# Patient Record
Sex: Female | Born: 1990 | Race: White | Hispanic: No | Marital: Single | Smoking: Current every day smoker
Health system: Southern US, Community
[De-identification: ages and names within clinical notes are randomized; demographics above are authoritative.]

## PROBLEM LIST (undated history)

## (undated) DIAGNOSIS — F419 Anxiety disorder, unspecified: Secondary | ICD-10-CM

## (undated) DIAGNOSIS — N926 Irregular menstruation, unspecified: Secondary | ICD-10-CM

## (undated) DIAGNOSIS — O1490 Unspecified pre-eclampsia, unspecified trimester: Secondary | ICD-10-CM

## (undated) DIAGNOSIS — Q270 Congenital absence and hypoplasia of umbilical artery: Secondary | ICD-10-CM

## (undated) DIAGNOSIS — F1910 Other psychoactive substance abuse, uncomplicated: Secondary | ICD-10-CM

## (undated) HISTORY — DX: Congenital absence and hypoplasia of umbilical artery: Q27.0

## (undated) HISTORY — DX: Irregular menstruation, unspecified: N92.6

## (undated) HISTORY — DX: Other psychoactive substance abuse, uncomplicated: F19.10

## (undated) HISTORY — DX: Anxiety disorder, unspecified: F41.9

## (undated) HISTORY — DX: Unspecified pre-eclampsia, unspecified trimester: O14.90

---
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
## 2010-02-28 ENCOUNTER — Emergency Department: Payer: Self-pay | Admitting: Emergency Medicine

## 2010-06-12 ENCOUNTER — Emergency Department: Payer: Self-pay | Admitting: Emergency Medicine

## 2010-07-03 ENCOUNTER — Emergency Department: Payer: Self-pay | Admitting: Unknown Physician Specialty

## 2013-02-21 LAB — HM PAP SMEAR: HM Pap smear: NEGATIVE

## 2013-06-26 DIAGNOSIS — O1490 Unspecified pre-eclampsia, unspecified trimester: Secondary | ICD-10-CM

## 2013-06-26 HISTORY — DX: Unspecified pre-eclampsia, unspecified trimester: O14.90

## 2013-09-08 ENCOUNTER — Inpatient Hospital Stay: Payer: Self-pay

## 2013-09-08 LAB — PIH PROFILE
AST: 24 U/L (ref 15–37)
Anion Gap: 6 — ABNORMAL LOW (ref 7–16)
Anion Gap: 7 (ref 7–16)
BUN: 6 mg/dL — ABNORMAL LOW (ref 7–18)
BUN: 6 mg/dL — AB (ref 7–18)
CALCIUM: 9.2 mg/dL (ref 8.5–10.1)
CO2: 22 mmol/L (ref 21–32)
CREATININE: 0.78 mg/dL (ref 0.60–1.30)
Calcium, Total: 9.1 mg/dL (ref 8.5–10.1)
Chloride: 109 mmol/L — ABNORMAL HIGH (ref 98–107)
Chloride: 110 mmol/L — ABNORMAL HIGH (ref 98–107)
Co2: 23 mmol/L (ref 21–32)
Creatinine: 0.86 mg/dL (ref 0.60–1.30)
EGFR (Non-African Amer.): >60
EGFR (Non-African Amer.): >60
Glucose: 83 mg/dL (ref 65–99)
Glucose: 98 mg/dL (ref 65–99)
HCT: 36.7 % (ref 35.0–47.0)
HCT: 36.8 % (ref 35.0–47.0)
HGB: 12.7 g/dL (ref 12.0–16.0)
HGB: 12.7 g/dL (ref 12.0–16.0)
MCH: 34.6 pg — AB (ref 26.0–34.0)
MCH: 34.5 pg — AB (ref 26.0–34.0)
MCHC: 34.5 g/dL (ref 32.0–36.0)
MCHC: 34.5 g/dL (ref 32.0–36.0)
MCV: 100 fL (ref 80–100)
MCV: 100 fL (ref 80–100)
OSMOLALITY: 272 (ref 275–301)
OSMOLALITY: 275 (ref 275–301)
POTASSIUM: 3.6 mmol/L (ref 3.5–5.1)
POTASSIUM: 3.6 mmol/L (ref 3.5–5.1)
Platelet: 297 10*3/uL (ref 150–440)
Platelet: 288 10*3/uL (ref 150–440)
RBC: 3.66 10*6/uL — ABNORMAL LOW (ref 3.80–5.20)
RBC: 3.68 10*6/uL — AB (ref 3.80–5.20)
RDW: 12.2 % (ref 11.5–14.5)
RDW: 12.3 % (ref 11.5–14.5)
SGOT(AST): 16 U/L (ref 15–37)
SODIUM: 139 mmol/L (ref 136–145)
Sodium: 138 mmol/L (ref 136–145)
URIC ACID: 4.3 mg/dL (ref 2.6–6.0)
Uric Acid: 4.2 mg/dL (ref 2.6–6.0)
WBC: 13.2 10*3/uL — AB (ref 3.6–11.0)
WBC: 12.9 10*3/uL — ABNORMAL HIGH (ref 3.6–11.0)

## 2013-09-08 LAB — DRUG SCREEN, URINE
Amphetamines, Ur Screen: NEGATIVE (ref ?–1000)
Barbiturates, Ur Screen: NEGATIVE (ref ?–200)
Benzodiazepine, Ur Scrn: NEGATIVE (ref ?–200)
Cannabinoid 50 Ng, Ur ~~LOC~~: NEGATIVE (ref ?–50)
Cocaine Metabolite,Ur ~~LOC~~: POSITIVE (ref ?–300)
MDMA (Ecstasy)Ur Screen: NEGATIVE (ref ?–500)
Methadone, Ur Screen: NEGATIVE (ref ?–300)
Opiate, Ur Screen: NEGATIVE (ref ?–300)
Phencyclidine (PCP) Ur S: NEGATIVE (ref ?–25)
TRICYCLIC, UR SCREEN: NEGATIVE (ref ?–1000)

## 2013-09-08 LAB — PROTEIN / CREATININE RATIO, URINE
CREATININE, URINE: 40.4 mg/dL (ref 30.0–125.0)
Protein, Random Urine: <5 mg/dL — ABNORMAL LOW (ref 0–12)

## 2013-09-09 LAB — PROTEIN, URINE, 24 HOUR
Collection Hours: 24 hours
Protein, 24 Hour Urine: 450 mg/24HR — ABNORMAL HIGH (ref 30–149)
Protein, Urine: 75 mg/dL (ref 0–12)
TOTAL VOLUME C4TV(ML): 600 mL

## 2013-09-10 DIAGNOSIS — O1490 Unspecified pre-eclampsia, unspecified trimester: Secondary | ICD-10-CM

## 2013-09-10 LAB — PIH PROFILE
ANION GAP: 6 — AB (ref 7–16)
AST: 23 U/L (ref 15–37)
BUN: 9 mg/dL (ref 7–18)
CALCIUM: 8.4 mg/dL — AB (ref 8.5–10.1)
CO2: 22 mmol/L (ref 21–32)
CREATININE: 0.79 mg/dL (ref 0.60–1.30)
Chloride: 109 mmol/L — ABNORMAL HIGH (ref 98–107)
EGFR (African American): >60
GLUCOSE: 94 mg/dL (ref 65–99)
HCT: 36.3 % (ref 35.0–47.0)
HGB: 12.3 g/dL (ref 12.0–16.0)
MCH: 34.3 pg — ABNORMAL HIGH (ref 26.0–34.0)
MCHC: 33.9 g/dL (ref 32.0–36.0)
MCV: 101 fL — ABNORMAL HIGH (ref 80–100)
OSMOLALITY: 272 (ref 275–301)
Platelet: 285 10*3/uL (ref 150–440)
Potassium: 3.7 mmol/L (ref 3.5–5.1)
RBC: 3.59 10*6/uL — AB (ref 3.80–5.20)
RDW: 12.7 % (ref 11.5–14.5)
SODIUM: 137 mmol/L (ref 136–145)
URIC ACID: 3.8 mg/dL (ref 2.6–6.0)
WBC: 17.9 10*3/uL — AB (ref 3.6–11.0)

## 2013-09-11 LAB — HEMATOCRIT: HCT: 30.3 % — AB (ref 35.0–47.0)

## 2013-09-12 LAB — PATHOLOGY REPORT

## 2013-09-12 LAB — BETA STREP CULTURE(ARMC)

## 2013-09-13 LAB — GC/CHLAMYDIA PROBE AMP

## 2014-10-17 NOTE — Consult Note (Signed)
   Maternal Age 24   Gravida 2   Para 0   Term Deliveries 0   Preterm Deliveries 0   Abortions 1   Living Children 0   Final EDD (dd-mmm-yy) 23-Oct-2013   Blood Type (Maternal) A positive   Antibody Screen Results (Maternal) negative   HIV Results (Maternal) negative   Gonorrhea Results (Maternal) negative   Chlamydia Results (Maternal) negative   Hepatitis C Culture (Maternal) unknown   Herpes Results (Maternal) n/a   VDRL/RPR/Syphilis Results (Maternal) negative   Rubella Results (Maternal) immune   Hepatitis B Surface Antigen Results (Maternal) negative   Group B Strep Results Maternal (Result >5wks must be treated as unknown) unknown/result > 5 weeks ago   Prenatal Care Adequate    Additional Comments Prenatal consult requested by Dr. Janene HarveyKlett, M.D. The patient is 24 yo G2 P 0010, EDD of 10/23/13 per 6 wk US. She is single and the dad is involved. [facility] at 4554w4d for routine visit and BP noted to be 130/80 & 150/100 with 1+proteinuria - sent for further evaluation. [facility], tobacco use (now approx 1 cig/day), SUA, EFW at 11% at 31 wks (f/u scheduled this week). Female fetus with 2 vessel cord. H/o migraines and anxiety previously treated with Gabapentin and Celexa in the past.    Patient was found to be positive for cocaine by UDS on admission. She has increased proteinuria and the fetus is in breech position. She has received 2 doses of betamethasone (3/16 and 3/17). She is on magnesium sulfate and also on IV labetalol for worsening hypertension; her proteinuria was 450 mg% on 3/17. The OB has decided to proceed with C/S today, 3/18 in the afternoon/evening. Indication for C/S: severe preeclampsia with worsening hypertension and breech.   Parental Contact Parents informed at length regarding prenatal care and plan, Discussed about the outcome of babies born at 33-34 wks, including survival, morbidity, and length of stay.   Recommended to pump and provide breast milk after delivery and she plans to do.  Answered all of her questions. Total time spent reviewing the note, counseling the parents and consult t note was 35 minutes.   Electronic Signatures: Lior Hoen, Margaretann LovelessGanapathy (MD)  (Signed 18-Mar-15 13:59)  Authored: PREGNANCY and LABOR, ADDITIONAL COMMENTS   Last Updated: 18-Mar-15 13:59 by Herminio Commonsama, Dshaun Reppucci (MD)

## 2014-10-17 NOTE — Op Note (Signed)
PATIENT NAME:  Carly SkeneCARTER, Elmyra S MR#:  [id number] DATE OF BIRTH:  Apr 25, 1991  DATE OF PROCEDURE:  09/15/2013  PREOPERATIVE DIAGNOSES: Breech with severe preeclampsia and superimposed cocaine abuse. Positive urine drug screen at time of delivery.  POSTOPERATIVE DIAGNOSES: Breech with severe preeclampsia and superimposed cocaine abuse. Positive urine drug screen at time of delivery. Preterm.   PROCEDURE: Primary LUT C-section.   SPINAL: Spinal.   SURGEON: Elliot Gurneyarrie C Nikelle Malatesta, M.D.   ASSISTANT: Farrel Connersolleen Gutierrez.   ESTIMATED BLOOD LOSS: 1000 mL.   FINDINGS: Preterm liveborn female infant by C-section, breech, Apgars 8 and 9, weighing 3 pounds, 8 ounces. Normal uterus, tubes and ovaries. No uterine septum; however, prominent tubal ostia fossa, normal placenta sent for pathology.   ESTIMATED BLOOD LOSS: (Dictation Anomaly)1000 cc .  DESCRIPTION OF PROCEDURE: The patient was taken to the Operating Room and placed in supine position. After adequate spinal anesthesia was instilled, the patient was prepped and draped in the usual sterile fashion. Pfannenstiel skin incision was drawn on the patient's belly. Incision was made with the knife and carried sharply down to the fascia. The fascia was nicked in the midline. The incision was extended in a superolateral manner with curved Mayo scissors. The fascia was grasped with Coker clamps and the fascia and muscles were sharply and bluntly dissected off each other both inferiorly and superiorly. The midline of the muscle bellies was identified, grasped and entered. This was opened with the surgeon's fingers and Metzenbaum. The peritoneum was grasped, cut and entered. Bladder blade was placed. A bladder flap was created. Uterine incision was made. The infant's head was delivered. The infant was bulb suctioned. The remainder of the infant was delivered. Cord was clamped and cut. Infant was handed to the awaiting pediatricians. Cord blood was obtained. Pitocin was  started. Placenta delivered on its own and sent to pathology. The uterus was delivered and wrapped in a moist laparotomy sponge. The interior of the uterus was curetted with a moist lap sponge. Uterine incision was closed with a running locked chromic suture and then again with a secondary imbricating suture. Bladder flap was tacked back up to the uterine incision with a 3-0 Monocryl. The belly was cleared of clots. The gutters were cleared of clot. The muscle belly was closed in approximated with 0 Vicryl suture in a running fashion. The On-Q trocars were placed. The catheters were threaded. Dermabond was placed. Catheters were placed beneath the fascia and above the muscle fascia was closed with a running Vicryl suture. EXLH was then used to approximate the subcutaneous fat across Camper's and Scarpa's fascia. 4-0 Monocryl was used to approximate the skin edges. The On-Q was wrapped around a 4 x 4 and tacked down with Steri-Strips and Tegaderm. Clear urine was noted in the Foley bag. The patient was taken to recovery after having tolerated the procedure well and after benzoin and Steri-Strips were placed on the uterine incision.   ____________________________ Elliot Gurneyarrie C. Eduarda Scrivens, MD cck:sg D: 09/15/2013 23:15:35 ET T: 09/16/2013 06:21:31 ET JOB#: [id number]  cc: Elliot Gurneyarrie C. Hikari Tripp, MD, <Dictator> Elliot GurneyARRIE C Tomica Arseneault MD ELECTRONICALLY SIGNED 09/30/2013 14:43

## 2014-11-03 NOTE — H&P (Signed)
L&D Evaluation:  History Expanded:  HPI 24 yo G2 P 0010, EDD of 10/23/13 per 6 wk US. Seen at office today at 6370w4d for routine visit and BP noted to be 130/80 & 150/100 with 1+proteinuria - sent for further evaluation. Pt denies recent headache or visual changes, RUQ, LOF, VB or ctx. +FM. [facility], tobacco use (now approx 1 cig/day), SUA, EFW at 11% at 31 wks (f/u scheduled this week). H/o migraines and anxiety previously treated with Gabapentin and Celexa. She has been off of these medications but has noted some anxiety sx recently.   Blood Type (Maternal) A positive   Group B Strep Results Maternal (Result >5wks must be treated as unknown) unknown/result > 5 weeks ago   Maternal HIV Negative   Maternal Syphilis Ab Nonreactive   Maternal Varicella Immune   Rubella Results (Maternal) immune   Maternal T-Dap Immune   Patient's Medical History Anxiety   Patient's Surgical History none   Medications Pre Natal Vitamins   Allergies Zyrtec   Social History tobacco   Exam:  Vital Signs BP >140/90  BP: 152-198/82-107, T: 99.1, P: 55-88,   Urine Protein trace, PC Ratio: protein too low to calculate   General no apparent distress   Mental Status clear   Chest expiratory wheezing - pt reports recent congestion and cough   Heart no murmur/gallop/rubs   Abdomen gravid, non-tender   Estimated Fetal Weight Small for gestational age   Edema no edema   Reflexes 2+   Mebranes Intact   FHT normal rate with no decels, category 1 tracing   Ucx absent   Skin dry   Impression:  Impression IUP at 7070w4d with gestational hypertension   Plan:  Comments POM reviewed with Dr Jean RosenthalJackson:  IV Labetalol for severe range BP's.  Betamethasone for fetal lung maturity Will obtain growth US today Begin 24 hour urine CXR as pt has had cough and wheezing Repeat labs this evening   Follow Up Appointment already scheduled   Electronic Signatures: Vella KohlerBrothers,  Argusta Mcgann K (CNM)  (Signed 16-Mar-15 15:22)  Authored: L&D Evaluation   Last Updated: 16-Mar-15 15:22 by Vella KohlerBrothers, Terryann Verbeek K (CNM)

## 2015-06-27 DIAGNOSIS — W01198A Fall on same level from slipping, tripping and stumbling with subsequent striking against other object, initial encounter: Secondary | ICD-10-CM | POA: Diagnosis not present

## 2015-06-27 DIAGNOSIS — Y998 Other external cause status: Secondary | ICD-10-CM | POA: Insufficient documentation

## 2015-06-27 DIAGNOSIS — S0993XA Unspecified injury of face, initial encounter: Secondary | ICD-10-CM | POA: Diagnosis present

## 2015-06-27 DIAGNOSIS — Y9301 Activity, walking, marching and hiking: Secondary | ICD-10-CM | POA: Diagnosis not present

## 2015-06-27 DIAGNOSIS — S01511A Laceration without foreign body of lip, initial encounter: Secondary | ICD-10-CM | POA: Insufficient documentation

## 2015-06-27 DIAGNOSIS — Y9289 Other specified places as the place of occurrence of the external cause: Secondary | ICD-10-CM | POA: Diagnosis not present
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
## 2015-06-28 ENCOUNTER — Emergency Department
Admission: EM | Admit: 2015-06-28 | Discharge: 2015-06-28 | Discharge status: Against Medical Advice | Payer: Medicaid Other | Attending: Emergency Medicine | Admitting: Emergency Medicine

## 2015-06-28 NOTE — ED Notes (Signed)
Called pt in lobby x3 without response.

## 2016-06-26 HISTORY — PX: MULTIPLE TOOTH EXTRACTIONS: SHX2053

## 2016-08-24 ENCOUNTER — Telehealth: Payer: Self-pay | Admitting: Obstetrics and Gynecology

## 2016-08-24 NOTE — Telephone Encounter (Signed)
Noted. Will order to arrive by apt date/time. 

## 2016-08-24 NOTE — Telephone Encounter (Signed)
Pt is schedule for annual with Jean RosenthalJackson on 09/19/16. Pt is Also schedule on 10/25/16 for removal and insertion for nexplanon.

## 2016-09-19 ENCOUNTER — Ambulatory Visit: Payer: Medicaid Other | Admitting: Obstetrics and Gynecology

## 2016-10-11 NOTE — Telephone Encounter (Signed)
Reschedule 10/25/16 from an nexplanon removal and insertion to Annual exam with [name]

## 2016-10-11 NOTE — Telephone Encounter (Signed)
Pt is schedule 11/07/16 for nexplanon removal and insertion

## 2016-10-11 NOTE — Telephone Encounter (Signed)
Pt was a No Show for AE 09/19/16. Please contact to R/S AE. Needs AE prior to Nexplanon Removal/Replacement which is scheduled for 10/25/16.

## 2016-10-12 NOTE — Telephone Encounter (Signed)
Noted. Will order to arrive by apt date/time. 

## 2016-10-25 ENCOUNTER — Telehealth: Payer: Self-pay

## 2016-10-25 ENCOUNTER — Encounter: Payer: Self-pay | Admitting: Obstetrics and Gynecology

## 2016-10-25 ENCOUNTER — Ambulatory Visit: Payer: Medicaid Other | Admitting: Obstetrics and Gynecology

## 2016-10-25 NOTE — Telephone Encounter (Signed)
Pt will need to be seen for Annual before anything is sent in since she cancelled todays appt. Pt needs to be aware that we will see pt for annual and will then schedule a separate appt for nexplanon removal and insertion for the same time.

## 2016-10-25 NOTE — Telephone Encounter (Signed)
Left detailed msg.

## 2016-10-25 NOTE — Telephone Encounter (Signed)
Pt calling - was supposed to have appt today but cancelled today.  Nexplanon is supposed to come out on the 6th.  Needs something called in to last her from the 6th until insertion of another nexplanon,.  [phone]

## 2016-11-07 ENCOUNTER — Ambulatory Visit: Payer: Medicaid Other | Admitting: Obstetrics and Gynecology

## 2016-11-27 ENCOUNTER — Ambulatory Visit: Payer: Medicaid Other | Admitting: Obstetrics and Gynecology

## 2017-09-07 ENCOUNTER — Ambulatory Visit (INDEPENDENT_AMBULATORY_CARE_PROVIDER_SITE_OTHER): Payer: Medicaid Other | Admitting: Maternal Newborn

## 2017-09-07 ENCOUNTER — Encounter: Payer: Self-pay | Admitting: Maternal Newborn

## 2017-09-07 VITALS — BP 110/72 | Wt 136.0 lb

## 2017-09-07 DIAGNOSIS — F1911 Other psychoactive substance abuse, in remission: Secondary | ICD-10-CM

## 2017-09-07 DIAGNOSIS — Z113 Encounter for screening for infections with a predominantly sexual mode of transmission: Secondary | ICD-10-CM | POA: Diagnosis not present

## 2017-09-07 DIAGNOSIS — Z98891 History of uterine scar from previous surgery: Secondary | ICD-10-CM

## 2017-09-07 DIAGNOSIS — Z124 Encounter for screening for malignant neoplasm of cervix: Secondary | ICD-10-CM

## 2017-09-07 DIAGNOSIS — O0990 Supervision of high risk pregnancy, unspecified, unspecified trimester: Secondary | ICD-10-CM

## 2017-09-07 DIAGNOSIS — Z0189 Encounter for other specified special examinations: Secondary | ICD-10-CM

## 2017-09-07 DIAGNOSIS — Z8759 Personal history of other complications of pregnancy, childbirth and the puerperium: Secondary | ICD-10-CM

## 2017-09-07 DIAGNOSIS — Z87898 Personal history of other specified conditions: Secondary | ICD-10-CM

## 2017-09-07 NOTE — Progress Notes (Signed)
C/o some bleeding, abd pains esp c stress. rj
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
## 2017-09-07 NOTE — Progress Notes (Signed)
Wants std testing - believes partner has something.rj

## 2017-09-09 DIAGNOSIS — O0990 Supervision of high risk pregnancy, unspecified, unspecified trimester: Secondary | ICD-10-CM | POA: Insufficient documentation

## 2017-09-09 DIAGNOSIS — Z98891 History of uterine scar from previous surgery: Secondary | ICD-10-CM | POA: Insufficient documentation

## 2017-09-09 DIAGNOSIS — F1911 Other psychoactive substance abuse, in remission: Secondary | ICD-10-CM | POA: Insufficient documentation

## 2017-09-09 DIAGNOSIS — Z8759 Personal history of other complications of pregnancy, childbirth and the puerperium: Secondary | ICD-10-CM | POA: Insufficient documentation

## 2017-09-10 ENCOUNTER — Other Ambulatory Visit: Payer: Self-pay | Admitting: Maternal Newborn

## 2017-09-10 DIAGNOSIS — N390 Urinary tract infection, site not specified: Secondary | ICD-10-CM

## 2017-09-10 LAB — URINE CULTURE

## 2017-09-10 LAB — URINE DRUG PANEL 7
Amphetamines, Urine: NEGATIVE ng/mL
Barbiturate Quant, Ur: NEGATIVE ng/mL
Benzodiazepine Quant, Ur: NEGATIVE ng/mL
Cannabinoid Quant, Ur: NEGATIVE ng/mL
Cocaine (Metab.): NEGATIVE ng/mL
Opiate Quant, Ur: NEGATIVE ng/mL
PCP QUANT UR: NEGATIVE ng/mL

## 2017-09-10 MED ORDER — CEPHALEXIN 500 MG PO CAPS: 500.0000 mg | ORAL_CAPSULE | Freq: Two times a day (BID) | ORAL | 0 refills | Status: DC

## 2017-09-11 ENCOUNTER — Encounter: Payer: Medicaid Other | Admitting: Obstetrics and Gynecology

## 2017-09-12 ENCOUNTER — Encounter: Payer: Medicaid Other | Admitting: Advanced Practice Midwife

## 2017-09-12 ENCOUNTER — Other Ambulatory Visit: Payer: Medicaid Other

## 2017-09-12 ENCOUNTER — Ambulatory Visit (INDEPENDENT_AMBULATORY_CARE_PROVIDER_SITE_OTHER): Payer: Medicaid Other

## 2017-09-12 ENCOUNTER — Other Ambulatory Visit: Payer: Self-pay | Admitting: Maternal Newborn

## 2017-09-12 ENCOUNTER — Encounter: Payer: Medicaid Other | Admitting: Maternal Newborn

## 2017-09-12 DIAGNOSIS — Z0189 Encounter for other specified special examinations: Secondary | ICD-10-CM

## 2017-09-12 DIAGNOSIS — Z3687 Encounter for antenatal screening for uncertain dates: Secondary | ICD-10-CM

## 2017-09-12 LAB — PAP LB, CT-NG TV RFX HPV ASCU
Chlamydia, Nuc. Acid Amp: NEGATIVE
Gonococcus, Nuc. Acid Amp: NEGATIVE
PAP Smear Comment: 0
Trich vag by NAA: POSITIVE — AB

## 2017-09-12 NOTE — Progress Notes (Signed)
No vb. No lof. U/s today.  

## 2017-09-13 NOTE — Progress Notes (Signed)
Patient was taken off my schedule after her ultrasound. I did not see her. This encounter was created in error - please disregard.

## 2017-09-18 ENCOUNTER — Other Ambulatory Visit: Payer: Self-pay | Admitting: Maternal Newborn

## 2017-09-18 DIAGNOSIS — A599 Trichomoniasis, unspecified: Secondary | ICD-10-CM

## 2017-09-18 DIAGNOSIS — N390 Urinary tract infection, site not specified: Secondary | ICD-10-CM

## 2017-09-18 MED ORDER — METRONIDAZOLE 500 MG PO TABS: 2000.0000 mg | ORAL_TABLET | Freq: Once | ORAL | 0 refills | Status: AC

## 2017-09-18 MED ORDER — CEPHALEXIN 500 MG PO CAPS: 500.0000 mg | ORAL_CAPSULE | Freq: Two times a day (BID) | ORAL | 0 refills | Status: AC

## 2017-09-18 MED ORDER — METRONIDAZOLE 500 MG PO TABS: 2000.0000 mg | ORAL_TABLET | Freq: Once | ORAL | 0 refills | Status: DC

## 2017-09-18 NOTE — Progress Notes (Signed)
Patient called, discussed lab results. [facility] [place]. She had not begun taking Keflex for UTI. [facility].  Marcelyn BruinsJacelyn Froilan Mclean, CNM 09/18/2017  12:51 PM

## 2017-09-18 NOTE — Progress Notes (Signed)
Rx sent to pharmacy for positive trichomoniasis, called patient about lab results/medication. Unable to leave message as no voice mailbox is set up. Will retry and send letter if unable to reach.  Marcelyn BruinsJacelyn Schmid, CNM 09/18/2017  11:57 AM

## 2017-10-01 ENCOUNTER — Telehealth: Payer: Self-pay

## 2017-10-01 NOTE — Telephone Encounter (Signed)
Pt called and stated she had a disagreement with her mother and is unable to get her prescriptions from the house and asked if she could have them refilled

## 2017-10-02 ENCOUNTER — Telehealth: Payer: Self-pay

## 2017-10-02 NOTE — Telephone Encounter (Signed)
Pharm request given to JS this am.

## 2017-10-02 NOTE — Telephone Encounter (Signed)
Pt is calling to have her prescriptions reorder sense she misplaced them.

## 2017-10-02 NOTE — Telephone Encounter (Signed)
Pharm request give to JS this am

## 2017-10-09 ENCOUNTER — Encounter: Payer: Medicaid Other | Admitting: Maternal Newborn

## 2017-10-09 ENCOUNTER — Other Ambulatory Visit: Payer: Medicaid Other

## 2017-10-09 ENCOUNTER — Ambulatory Visit (INDEPENDENT_AMBULATORY_CARE_PROVIDER_SITE_OTHER): Payer: Medicaid Other | Admitting: Maternal Newborn

## 2017-10-09 VITALS — BP 110/70 | Wt 142.0 lb

## 2017-10-09 DIAGNOSIS — N912 Amenorrhea, unspecified: Secondary | ICD-10-CM

## 2017-10-09 DIAGNOSIS — Z111 Encounter for screening for respiratory tuberculosis: Secondary | ICD-10-CM

## 2017-10-09 DIAGNOSIS — A599 Trichomoniasis, unspecified: Secondary | ICD-10-CM | POA: Diagnosis not present

## 2017-10-09 DIAGNOSIS — N390 Urinary tract infection, site not specified: Secondary | ICD-10-CM | POA: Diagnosis not present

## 2017-10-09 MED ORDER — METRONIDAZOLE 500 MG PO TABS: 2000.0000 mg | ORAL_TABLET | Freq: Once | ORAL | 0 refills | Status: AC

## 2017-10-09 MED ORDER — CEPHALEXIN 500 MG PO CAPS: 500.0000 mg | ORAL_CAPSULE | Freq: Two times a day (BID) | ORAL | 0 refills | Status: AC

## 2017-10-09 NOTE — Progress Notes (Signed)
C/o was unable to finish antibx d/t moving; BP has been up and down.rj

## 2017-10-10 ENCOUNTER — Encounter: Payer: Self-pay | Admitting: Maternal Newborn

## 2017-10-10 NOTE — Progress Notes (Signed)
Here for follow-up after initial prenatal (3/15); labs showed UTI and trichomoniasis and ultrasound done on 3/20 showed no sign of an IUP. Patient has no menses; has not taken prescribed medications for findings at first visit. Beta hCG today to confirm that she is not pregnant. Re-sent Rx to pharmacy for medications to treat trichomoniasis and UTI. She needs a follow-up visit to remove her expired Nexplanon.  A total of 10 minutes were spent in face-to-face contact with the patient during this encounter with over half of that time devoted to counseling and coordination of care.  Marcelyn BruinsJacelyn Schmid, CNM 10/10/2017  7:03 PM

## 2017-10-11 ENCOUNTER — Encounter: Payer: Self-pay | Admitting: Maternal Newborn

## 2017-10-11 ENCOUNTER — Encounter: Payer: Medicaid Other | Admitting: Advanced Practice Midwife

## 2017-10-12 LAB — QUANTIFERON-TB GOLD PLUS
QUANTIFERON NIL VALUE: 0.13 [IU]/mL
QuantiFERON Mitogen Value: >10 IU/mL
QuantiFERON TB1 Ag Value: 0.11 IU/mL
QuantiFERON TB2 Ag Value: 0.12 IU/mL
QuantiFERON-TB Gold Plus: NEGATIVE

## 2017-10-12 LAB — BETA HCG QUANT (REF LAB): hCG Quant: <1 m[IU]/mL

## 2017-10-15 ENCOUNTER — Encounter: Payer: Self-pay | Admitting: Maternal Newborn

## 2017-10-15 ENCOUNTER — Other Ambulatory Visit: Payer: Self-pay | Admitting: Maternal Newborn

## 2017-10-15 DIAGNOSIS — N898 Other specified noninflammatory disorders of vagina: Secondary | ICD-10-CM

## 2017-10-15 MED ORDER — FLUCONAZOLE 150 MG PO TABS: 150.0000 mg | ORAL_TABLET | Freq: Once | ORAL | 0 refills | Status: AC

## 2017-10-16 ENCOUNTER — Telehealth: Payer: Self-pay | Admitting: Maternal Newborn

## 2017-10-16 ENCOUNTER — Encounter: Payer: Self-pay | Admitting: Maternal Newborn

## 2017-10-16 NOTE — Telephone Encounter (Signed)
Please schedule. Thx  ----- Message -----  From: Jones Skeneourtney S Rolfe  Sent: 10/15/2017  6:01 AM  To: [facility]Clinical  Subject: RE: RE: Non-Urgent Medical Question         ----- Message from Mychart, Generic sent at 10/15/2017 6:01 AM EDT -----      I was unable to get to the phone that day. If you would have someone to call with an appointment. If not I can call later today I have one at 11:30 tomorrow I. [place]. But otherwise m-thurs i am available. Thanks have a great day    Mellissa KohutCourtney Valenti  [phone]   ----- Message -----  From: Charlies SilversMA Kimberly J  Sent: 10/11/17 8:53 AM  To: Jones Skeneourtney S Dollinger  Subject: RE: Non-Urgent Medical Question    Rescheduling is fine. I will get Huntley DecSara to call you soon so we can get you back on the schedule at your convenience.  Have an awesome Easter.   Kim CMA (AAMA)    ----- Message -----   From: Jones Skeneourtney S Ewen   Sent: 10/11/2017 7:33 AM EDT    To: Oswaldo ConroyJacelyn Y Schmid, CNM  Subject: Non-Urgent Medical Question    Good morning I am unable to make my appointment this morning due to transportation issues. I apologize for the inconvenience can I reschedule for asap    Any questions please feel free to call me at   [phone]   Called and left message for patient to call back to be schedule

## 2017-10-17 ENCOUNTER — Encounter: Payer: Medicaid Other | Admitting: Obstetrics and Gynecology

## 2017-10-30 ENCOUNTER — Encounter: Payer: Self-pay | Admitting: Obstetrics and Gynecology

## 2017-10-30 ENCOUNTER — Ambulatory Visit (INDEPENDENT_AMBULATORY_CARE_PROVIDER_SITE_OTHER): Payer: Medicaid Other | Admitting: Obstetrics and Gynecology

## 2017-10-30 VITALS — BP 104/62 | HR 87 | Ht 60.0 in | Wt 144.0 lb

## 2017-10-30 DIAGNOSIS — Z3046 Encounter for surveillance of implantable subdermal contraceptive: Secondary | ICD-10-CM | POA: Diagnosis not present

## 2017-10-30 DIAGNOSIS — N912 Amenorrhea, unspecified: Secondary | ICD-10-CM

## 2017-10-30 NOTE — Progress Notes (Signed)
   Chief Complaint  Patient presents with  . Contraception    Nexplanon removal      History of Present Illness:  Carly Schmidt is a 27 y.o. that had a nexplanon placed approximately 4 years  ago. Since that time, she has been amenorrheic. Hx of oligomenorrhea prior to nexplanon. Doesn't want any BC for now. Thought she was pregnant 3/19 but wasn't. Had annual/pap at 3/19 prenatal appt. She is sex active.   Nexplanon removal Procedure note - The Nexplanon was noted in the patient's arm and the end was identified. The skin was cleansed with a Betadine solution. A small injection of subcutaneous lidocaine with epinephrine was given over the end of the implant. An incision was made at the end of the implant. The rod was noted in the incision and grasped with a hemostat. It was noted to be intact.  Steri-Strip was placed approximating the incision. Hemostasis was noted.  Assessment: Nexplanon removal  Amenorrhea - Since nexplanon but expired a yr ago. Hx of oligomenorrhea prior to nexplanon. See if menses resume after rem. F/u if not for Rx provera.   Pt to use condoms in meantime.   Plan:   She was told to remove the dressing in 12-24 hours, to keep the incision area dry for 24 hours and to remove the Steristrip in 2-3  days.  Notify us if any signs of tenderness, redness, pain, or fevers develop.   Antionio Negron B. Joua Bake, PA-C 10/30/2017 11:38 AM

## 2017-10-30 NOTE — Patient Instructions (Signed)
I value your feedback and entrusting us with your care. If you get a [facility] patient survey, I would appreciate you taking the time to let us know about your experience today. Thank you!  Remove the dressing in 12-24 hours,  keep the incision area dry for 24 hours and remove the Steristrip in 2-3  days.  Notify us if any signs of tenderness, redness, pain, or fevers develop. 

## 2017-11-26 ENCOUNTER — Ambulatory Visit: Payer: Medicaid Other | Admitting: Maternal Newborn

## 2018-01-29 ENCOUNTER — Ambulatory Visit: Payer: Medicaid Other | Admitting: Obstetrics and Gynecology

## 2018-02-13 ENCOUNTER — Ambulatory Visit: Payer: Medicaid Other | Admitting: Obstetrics and Gynecology

## 2018-02-14 ENCOUNTER — Ambulatory Visit (INDEPENDENT_AMBULATORY_CARE_PROVIDER_SITE_OTHER): Payer: Medicaid Other | Admitting: Obstetrics and Gynecology

## 2018-02-14 ENCOUNTER — Encounter: Payer: Self-pay | Admitting: Obstetrics and Gynecology

## 2018-02-14 VITALS — BP 110/70 | HR 74 | Ht 59.0 in | Wt 148.0 lb

## 2018-02-14 DIAGNOSIS — R34 Anuria and oliguria: Secondary | ICD-10-CM | POA: Diagnosis not present

## 2018-02-14 DIAGNOSIS — Z30011 Encounter for initial prescription of contraceptive pills: Secondary | ICD-10-CM | POA: Diagnosis not present

## 2018-02-14 DIAGNOSIS — N912 Amenorrhea, unspecified: Secondary | ICD-10-CM | POA: Diagnosis not present

## 2018-02-14 DIAGNOSIS — Z3202 Encounter for pregnancy test, result negative: Secondary | ICD-10-CM

## 2018-02-14 LAB — POCT URINALYSIS DIPSTICK
BILIRUBIN UA: NEGATIVE
Blood, UA: NEGATIVE
Glucose, UA: NEGATIVE
KETONES UA: NEGATIVE
Leukocytes, UA: NEGATIVE
Nitrite, UA: NEGATIVE
PH UA: 5 (ref 5.0–8.0)
PROTEIN UA: NEGATIVE
Spec Grav, UA: 1.02 (ref 1.010–1.025)

## 2018-02-14 LAB — POCT URINE PREGNANCY: Preg Test, Ur: NEGATIVE

## 2018-02-14 MED ORDER — MEDROXYPROGESTERONE ACETATE 10 MG PO TABS: 10.0000 mg | ORAL_TABLET | Freq: Every day | ORAL | 0 refills | Status: AC

## 2018-02-14 MED ORDER — NORETHIN ACE-ETH ESTRAD-FE 1-20 MG-MCG PO TABS: 1.0000 | ORAL_TABLET | Freq: Every day | ORAL | 2 refills | Status: DC

## 2018-02-14 NOTE — Progress Notes (Signed)
Patient, No Pcp Per   Chief Complaint  Patient presents with  . Follow-up    medication, hasnt had a period since nexplanon removal, pt think she has a UTI, no burning or frequency, urinating times are apart (longer than normal), wants BC    HPI:      Ms. Carly Schmidt is a 27 y.o. [phone] who LMP was No LMP recorded (lmp unknown). (Menstrual status: Irregular Periods)., presents today to start [facility]. Nexplanon removed 10/30/17 because she didn't want BC any longer and hasn't had a period since. Didn't have periods on nexplanon either. Would like to restart OCPs. Did in past without side effects. No hx of HTN, DVTs, seizures. Hx of oligomenorrhea prior to nexplanon. Menses usually Q3-4 months, lasting 3 days, no BTB. Mild dysmen. She is sex active, no condom use.  No pelvic pain/dyspareunia.   Pt notes decreased urine ouput recently and concentrated urine. Drinks about 16 oz water daily. Has cut out soda use due to cause of UTIs.   Last annual at [facility] 3/19.  Past Medical History:  Diagnosis Date  . Anxiety   . Irregular menses   . Pre-eclampsia 2015   SEVERE  . 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 Substance abuse [facility])     Past Surgical History:  Procedure Laterality Date  . MULTIPLE TOOTH EXTRACTIONS  2018   all teeth removed; incl wisdon teeth    Family History  Problem Relation Age of Onset  . Cervical cancer Mother   . Cancer Mother 84       skin  . Breast cancer Maternal Grandmother   . Hypertension Paternal Grandmother   . Cancer Paternal Grandmother        SKIN    Social History   Socioeconomic History  . Marital status: Single    Spouse name: Not on file  . Number of children: 1  . Years of education: 31  . Highest education level: Not on file  Occupational History  . 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 Financial resource strain: Not on file  . Food insecurity:    Worry: Not on file    Inability: Not on file  . Transportation  needs:    Medical: Not on file    Non-medical: Not on file  Tobacco Use  . Smoking status: Current Every Day Smoker  . Smokeless tobacco: Never Used  Substance and Sexual Activity  . Alcohol use: No    Frequency: Never    Comment: FORMER  . Drug use: No  . Sexual activity: Yes    Birth control/protection: None  Lifestyle  . Physical activity:    Days per week: Not on file    Minutes per session: Not on file  . Stress: Not on file  Relationships  . Social connections:    Talks on phone: Not on file    Gets together: Not on file    Attends religious service: Not on file    Active member of club or organization: Not on file    Attends meetings of clubs or organizations: Not on file    Relationship status: Not on file  . Intimate partner violence:    Fear of current or ex partner: Not on file    Emotionally abused: Not on file    Physically abused: Not on file    Forced sexual activity: Not on file  Other Topics Concern  . Not on file  Social  History Narrative  . Not on file    Outpatient Medications Prior to Visit  Medication Sig Dispense Refill  . fluconazole (DIFLUCAN) 150 MG tablet PLEASE SEE ATTACHED FOR DETAILED DIRECTIONS  0  . NICOTINE STEP 1 21 MG/24HR patch APPLY 1 PATCH TO SKIN DAILY  0  . SUBOXONE 8-2 MG FILM DISSOLVE 1/2 FILM UNDER TONGUE TWICE A DAY AND 1/4 FILM IN THE EVENING  0  . buprenorphine-naloxone (SUBOXONE) 2-0.5 mg SUBL SL tablet Place 1 tablet under the tongue daily.    Marland Kitchen. etonogestrel (NEXPLANON) 68 MG IMPL implant 1 each by Subdermal route once.     No facility-administered medications prior to visit.     ROS:  Review of Systems  Constitutional: Negative for fever.  Gastrointestinal: Negative for blood in stool, constipation, diarrhea, nausea and vomiting.  Genitourinary: Positive for menstrual problem. Negative for dyspareunia, dysuria, flank pain, frequency, hematuria, urgency, vaginal bleeding, vaginal discharge and vaginal pain.    Musculoskeletal: Negative for back pain.  Skin: Negative for rash.    OBJECTIVE:   Vitals:  BP 110/70   Pulse 74   Ht 4\' 11"  (1.499 m)   Wt 148 lb (67.1 kg)   LMP  (LMP Unknown)   Breastfeeding? No   BMI 29.89 kg/m   Physical Exam  Constitutional: She is oriented to person, place, and time. Vital signs are normal. She appears well-developed.  Pulmonary/Chest: Effort normal.  Genitourinary: Vagina normal and uterus normal. There is no rash, tenderness or lesion on the right labia. There is no rash, tenderness or lesion on the left labia. Uterus is not enlarged and not tender. Cervix exhibits no motion tenderness. Right adnexum displays no mass and no tenderness. Left adnexum displays no mass and no tenderness. No tenderness in the vagina.  Musculoskeletal: Normal range of motion.  Neurological: She is alert and oriented to person, place, and time.  Psychiatric: She has a normal mood and affect. Her behavior is normal. Thought content normal.  Vitals reviewed.   Results: Results for orders placed or performed in visit on 02/14/18 (from the past 24 hour(s))  POCT Urinalysis Dipstick     Status: Normal   Collection Time: 02/14/18 10:43 AM  Result Value Ref Range   Color, UA yellow    Clarity, UA clear    Glucose, UA Negative Negative   Bilirubin, UA neg    Ketones, UA neg    Spec Grav, UA 1.020 1.010 - 1.025   Blood, UA neg    pH, UA 5.0 5.0 - 8.0   Protein, UA Negative Negative   Urobilinogen, UA     Nitrite, UA neg    Leukocytes, UA Negative Negative   Appearance     Odor    POCT urine pregnancy     Status: Normal   Collection Time: 02/14/18 10:43 AM  Result Value Ref Range   Preg Test, Ur Negative Negative     Assessment/Plan: Amenorrhea - Neg UPT. Hx of oligomenorrhea. Rx provera. OCP start with next menses. F/u if no withdrawal bleed after depo. - Plan: POCT urine pregnancy, medroxyPROGESTERone (PROVERA) 10 MG tablet  Encounter for initial prescription of  contraceptive pills - OCP start with withdrawal bleed. Rx junel. F/u prn. Condoms.  - Plan: norethindrone-ethinyl estradiol (JUNEL FE 1/20) 1-20 MG-MCG tablet  Decreased urination - Neg dip. Due to dehydration. Increased water to 64 oz daily, at least. F/u prn.  - Plan: POCT Urinalysis Dipstick    Meds ordered this encounter  Medications  .  medroxyPROGESTERone (PROVERA) 10 MG tablet    Sig: Take 1 tablet (10 mg total) by mouth daily for 7 days.    Dispense:  7 tablet    Refill:  0    Order Specific Question:   Supervising Provider    Answer:   Nadara Mustard [id number]  . norethindrone-ethinyl estradiol (JUNEL FE 1/20) 1-20 MG-MCG tablet    Sig: Take 1 tablet by mouth daily.    Dispense:  3 Package    Refill:  2    Order Specific Question:   Supervising Provider    Answer:   Nadara Mustard [id number]      Return if symptoms worsen or fail to improve.  Alicia B. Copland, PA-C 02/14/2018 10:45 AM

## 2018-02-14 NOTE — Patient Instructions (Signed)
I value your feedback and entrusting us with your care. If you get a [facility] patient survey, I would appreciate you taking the time to let us know about your experience today. Thank you! 

## 2018-02-18 ENCOUNTER — Encounter: Payer: Self-pay | Admitting: Obstetrics and Gynecology

## 2018-04-17 ENCOUNTER — Telehealth: Payer: Self-pay

## 2018-04-17 ENCOUNTER — Other Ambulatory Visit: Payer: Self-pay | Admitting: Obstetrics and Gynecology

## 2018-04-17 DIAGNOSIS — Z30011 Encounter for initial prescription of contraceptive pills: Secondary | ICD-10-CM
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
## 2018-04-17 NOTE — Telephone Encounter (Signed)
Called pts pharmacy and they stated earliest she can get RF is 11/3. Per ABC give pt sample of [place], this one has 4 placebo pills compared to the 7 placebo pills in her current [facility]. Samples are at front desk.

## 2018-09-11 ENCOUNTER — Telehealth: Payer: Self-pay

## 2018-09-11 DIAGNOSIS — Z30011 Encounter for initial prescription of contraceptive pills: Secondary | ICD-10-CM

## 2018-09-11 MED ORDER — NORETHIN ACE-ETH ESTRAD-FE 1-20 MG-MCG PO TABS: 1.0000 | ORAL_TABLET | Freq: Every day | ORAL | 0 refills | Status: DC
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
## 2018-11-18 ENCOUNTER — Other Ambulatory Visit: Payer: Self-pay | Admitting: Obstetrics and Gynecology

## 2018-11-18 DIAGNOSIS — Z30011 Encounter for initial prescription of contraceptive pills: Secondary | ICD-10-CM

## 2018-11-26 ENCOUNTER — Other Ambulatory Visit: Payer: Self-pay | Admitting: Obstetrics and Gynecology

## 2018-11-26 ENCOUNTER — Telehealth: Payer: Self-pay

## 2018-11-26 MED ORDER — NITROFURANTOIN MONOHYD MACRO 100 MG PO CAPS: 100.0000 mg | ORAL_CAPSULE | Freq: Two times a day (BID) | ORAL | 0 refills | Status: AC

## 2018-11-26 NOTE — Telephone Encounter (Signed)
Frequent urination, little burning sensation urinating, lower back pain, no blood in urine x 2 weeks

## 2018-11-26 NOTE — Telephone Encounter (Signed)
Rx macrobid eRxd. RTO if sx persist.

## 2018-11-26 NOTE — Telephone Encounter (Signed)
Pt calling for antibx to be called in as she thinks she has a bladder inf or urinary track inf; is prone to them.  [phone]

## 2018-11-26 NOTE — Progress Notes (Signed)
Rx macrobid for UTI sx. Hx of UTIs in past, susc to macrobid. RTO if sx persist.

## 2018-11-26 NOTE — Telephone Encounter (Signed)
Pt aware.

## 2018-12-14 ENCOUNTER — Other Ambulatory Visit: Payer: Self-pay | Admitting: Obstetrics and Gynecology

## 2018-12-14 DIAGNOSIS — Z30011 Encounter for initial prescription of contraceptive pills: Secondary | ICD-10-CM

## 2019-01-03 ENCOUNTER — Other Ambulatory Visit: Payer: Self-pay | Admitting: Obstetrics and Gynecology

## 2019-01-03 DIAGNOSIS — Z30011 Encounter for initial prescription of contraceptive pills: Secondary | ICD-10-CM

## 2019-07-18 ENCOUNTER — Ambulatory Visit: Payer: Medicaid Other

## 2019-07-18 ENCOUNTER — Ambulatory Visit
Admission: EM | Admit: 2019-07-18 | Discharge: 2019-07-18 | Disposition: A | Discharge status: Home / Self Care | Payer: Medicaid Other | Attending: Family Medicine | Admitting: Family Medicine

## 2019-07-18 ENCOUNTER — Other Ambulatory Visit: Payer: Self-pay

## 2019-07-18 ENCOUNTER — Encounter: Payer: Self-pay | Admitting: Emergency Medicine

## 2019-07-18 DIAGNOSIS — M654 Radial styloid tenosynovitis [de Quervain]: Secondary | ICD-10-CM | POA: Insufficient documentation

## 2019-07-18 MED ORDER — MELOXICAM 15 MG PO TABS: 15.0000 mg | ORAL_TABLET | Freq: Every day | ORAL | 0 refills | Status: AC | PRN

## 2019-07-18 NOTE — ED Provider Notes (Signed)
[facility]    CSN: [id number] Arrival date & time: 07/18/19  1702      History   Chief Complaint Chief Complaint  Patient presents with  . Hand Pain    left  . Wrist Pain    left    HPI   29 year old female presents with left wrist pain.  Patient reports that she has had pain around her left thumb and left wrist since yesterday.  No fall, trauma, injury.  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  She states that she is quite heavy.  She has fractured this wrist on more than one occasion previously.  Pain is severe.  Worse with movement of the left thumb.  Decreased range of motion of the left wrist.  Better with compression.  No other reported symptoms.  No other complaints.  Past Medical History:  Diagnosis Date  . Anxiety   . Irregular menses   . Pre-eclampsia 2015   SEVERE  . 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 Substance abuse [facility])     Patient Active Problem List   Diagnosis Date Noted  . Supervision of high risk pregnancy, antepartum 09/09/2017  . History of substance abuse (HCC) 09/09/2017  . History of pre-eclampsia 09/09/2017  . History of cesarean delivery 09/09/2017    Past Surgical History:  Procedure Laterality Date  . MULTIPLE TOOTH EXTRACTIONS  2018   all teeth removed; incl wisdon teeth    OB History    Gravida  3   Para  1   Term      Preterm  1   AB  1   Living  1     SAB  1   TAB      Ectopic      Multiple      Live Births  1            Home Medications    Prior to Admission medications   Medication Sig Start Date End Date Taking? Authorizing Provider  SUBOXONE 8-2 MG FILM DISSOLVE 1/2 FILM UNDER TONGUE TWICE A DAY AND 1/4 FILM IN THE EVENING 10/26/17  Yes [provider]  medroxyPROGESTERone (PROVERA) 10 MG tablet Take 1 tablet (10 mg total) by mouth daily for 7 days. 02/14/18 02/21/18  Copland, Ilona Sorrel, PA-C  meloxicam (MOBIC) 15 MG tablet Take 1 tablet (15 mg  total) by mouth daily as needed for pain. 07/18/19   Tommie Sams, DO  NICOTINE STEP 1 21 MG/24HR patch APPLY 1 PATCH TO SKIN DAILY 10/04/17   [provider]  JUNEL FE 1/20 1-20 MG-MCG tablet TAKE 1 TABLET BY MOUTH EVERY DAY 11/19/18 07/18/19  Copland, Ilona Sorrel, PA-C    Family History Family History  Problem Relation Age of Onset  . Cervical cancer Mother   . Cancer Mother 72       skin  . Breast cancer Maternal Grandmother   . Hypertension Paternal Grandmother   . Cancer Paternal Grandmother        SKIN    Social History Social History   Tobacco Use  . Smoking status: Current Every Day Smoker    Types: Cigarettes  . Smokeless tobacco: Never Used  Substance Use Topics  . Alcohol use: No    Comment: FORMER  . Drug use: No     Allergies   Zyrtec [cetirizine]   Review of Systems Review of Systems  Constitutional: Negative.  Musculoskeletal:       Left wrist and thumb pain.   Physical Exam Triage Vital Signs ED Triage Vitals  Enc Vitals Group     BP 07/18/19 1738 139/89     Pulse Rate 07/18/19 1738 61     Resp 07/18/19 1738 14     Temp 07/18/19 1738 98.2 F (36.8 C)     Temp Source 07/18/19 1738 Oral     SpO2 07/18/19 1738 100 %     Weight 07/18/19 1735 150 lb (68 kg)     Height 07/18/19 1735 5' (1.524 m)     Head Circumference --      Peak Flow --      Pain Score 07/18/19 1735 7     Pain Loc --      Pain Edu? --      Excl. in [place]? --    Updated Vital Signs BP 139/89 (BP Location: Right Arm)   Pulse 61   Temp 98.2 F (36.8 C) (Oral)   Resp 14   Ht 5' (1.524 m)   Wt 68 kg   LMP 07/16/2019 (Exact Date)   SpO2 100%   BMI 29.29 kg/m   Visual Acuity Right Eye Distance:   Left Eye Distance:   Bilateral Distance:    Right Eye Near:   Left Eye Near:    Bilateral Near:     Physical Exam Vitals and nursing note reviewed.  Constitutional:      General: She is not in acute distress.    Appearance: Normal appearance. She is not  ill-appearing.  HENT:     Head: Normocephalic and atraumatic.  Eyes:     General:        Right eye: No discharge.     Conjunctiva/sclera: Conjunctivae normal.  Cardiovascular:     Rate and Rhythm: Normal rate and regular rhythm.     Heart sounds: No murmur.  Pulmonary:     Effort: Pulmonary effort is normal.     Breath sounds: Normal breath sounds. No wheezing, rhonchi or rales.  Musculoskeletal:     Comments: Left wrist -tenderness over the distal radius and extending proximally.  Patient unable to do Grano test due to pain.  Neurological:     Mental Status: She is alert.  Psychiatric:        Behavior: Behavior normal.     Comments: Tearful.    UC Treatments / Results  Labs (all labs ordered are listed, but only abnormal results are displayed) Labs Reviewed - No data to display  EKG   Radiology DG Wrist Complete Left  Result Date: 07/18/2019 CLINICAL DATA:  Radial LEFT wrist pain since yesterday, no known injury EXAM: LEFT WRIST - COMPLETE 3+ VIEW COMPARISON:  None FINDINGS: Osseous mineralization normal. Joint spaces preserved. No fracture, dislocation, or bone destruction. IMPRESSION: Normal exam. Electronically Signed   By: Lavonia Dana M.D.   On: 07/18/2019 18:10    Procedures Procedures (including critical care time)  Medications Ordered in UC Medications - No data to display  Initial Impression / Assessment and Plan / UC Course  I have reviewed the triage vital signs and the nursing notes.  Pertinent labs & imaging results that were available during my care of the patient were reviewed by me and considered in my medical decision making (see chart for details).    29 year old female presents with de Quervain's tenosynovitis.  Placed in thumb spica splint.  Meloxicam as directed.  Supportive care.  Final Clinical  Impressions(s) / UC Diagnoses   Final diagnoses:  De Quervain's disease (tenosynovitis)   Discharge Instructions   None    ED  Prescriptions    Medication Sig Dispense Auth. Provider   meloxicam (MOBIC) 15 MG tablet Take 1 tablet (15 mg total) by mouth daily as needed for pain. 14 tablet Tommie Sams, DO     PDMP not reviewed this encounter.   Tommie Sams, [place] 07/18/19 [phone]

## 2019-07-18 NOTE — ED Triage Notes (Signed)
Patient c/o pain in her left wrist, hand and thumb that started yesterday.  Patient denies any injury or fall.  Patient states that as a child she broke her left wrist.

## 2020-01-28 ENCOUNTER — Ambulatory Visit: Payer: Medicaid Other

## 2020-01-28 ENCOUNTER — Other Ambulatory Visit: Payer: Self-pay

## 2020-02-19 ENCOUNTER — Ambulatory Visit (LOCAL_COMMUNITY_HEALTH_CENTER): Payer: Medicaid Other

## 2020-02-19 ENCOUNTER — Other Ambulatory Visit: Payer: Self-pay

## 2020-02-19 DIAGNOSIS — Z719 Counseling, unspecified: Secondary | ICD-10-CM

## 2020-02-19 NOTE — Progress Notes (Signed)
Patient in clinic today with her daughter reporting that they both have super lice, "they are clear and they are getting in my ears". RN observed patient scratching head, crying and rubbing ears.  Ears were blood red, feet/toes had skin peeling, sores noted on left side of head and a significant size boil noticed on patient's left forearm.  States she has used all OTC Lice products and other remedies such as vinegar and vodka.   Patient and daughter were both examined by 3 RN's and no lice or nits were found.  Patient continued stating "there they are, get it before they disappear". Clinic supervisor reached out to Phineas Real to connect patient and daughter to PCP and other services available. Richmond Campbell, RN

## 2021-06-17 IMAGING — CR DG WRIST COMPLETE 3+V*L*
4 series · 4 of 4 positions shown · non-contrast
Comparison: None

CLINICAL DATA: Radial LEFT wrist pain since yesterday, no known
injury

EXAM:
LEFT WRIST - COMPLETE 3+ VIEW

[wrist pa]
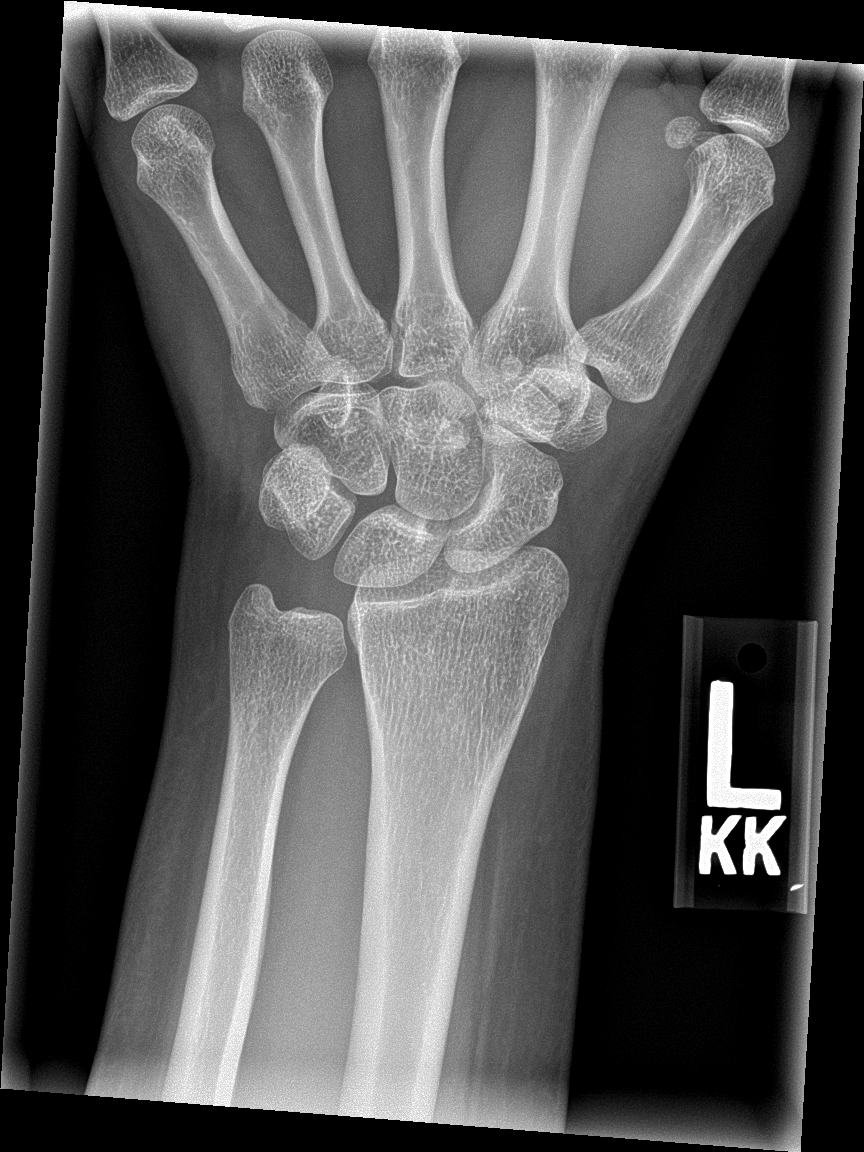

[wrist obl]
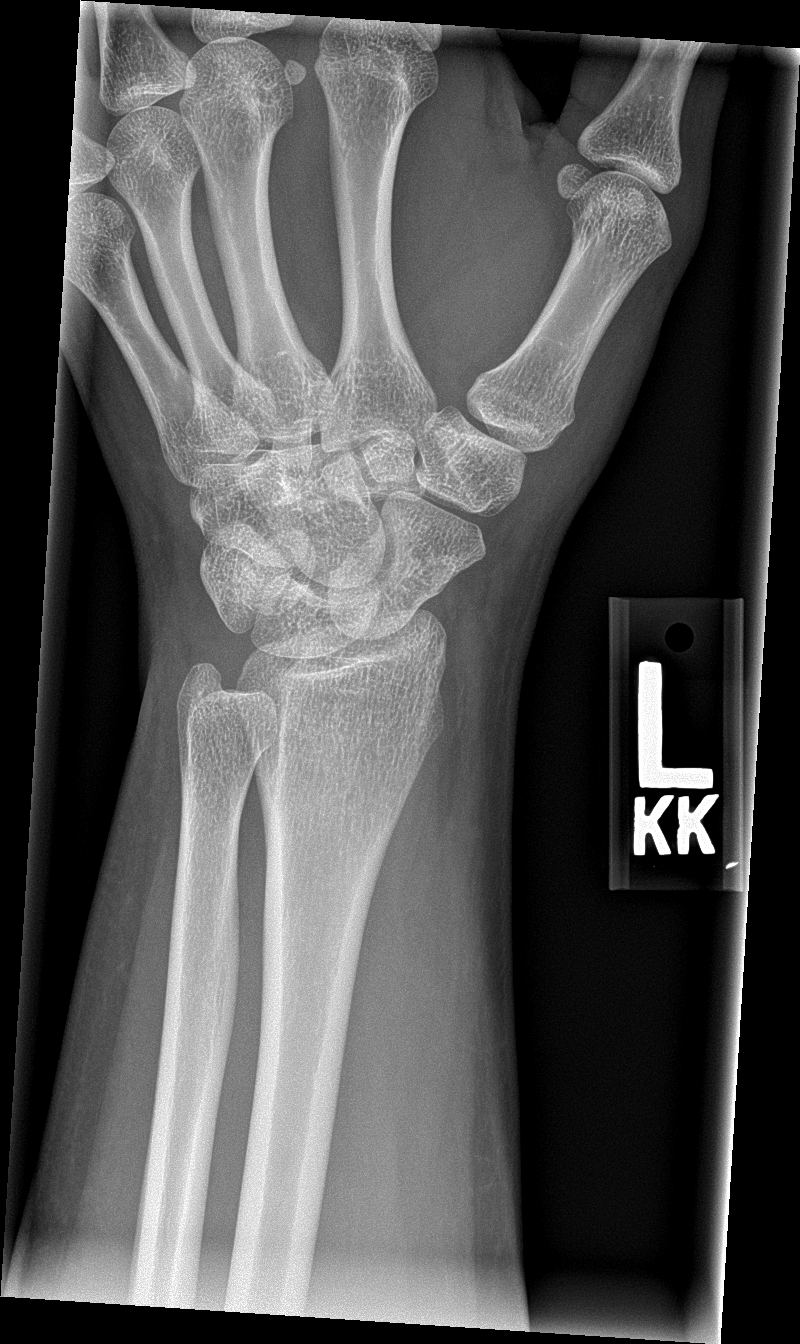

[wrist lat]
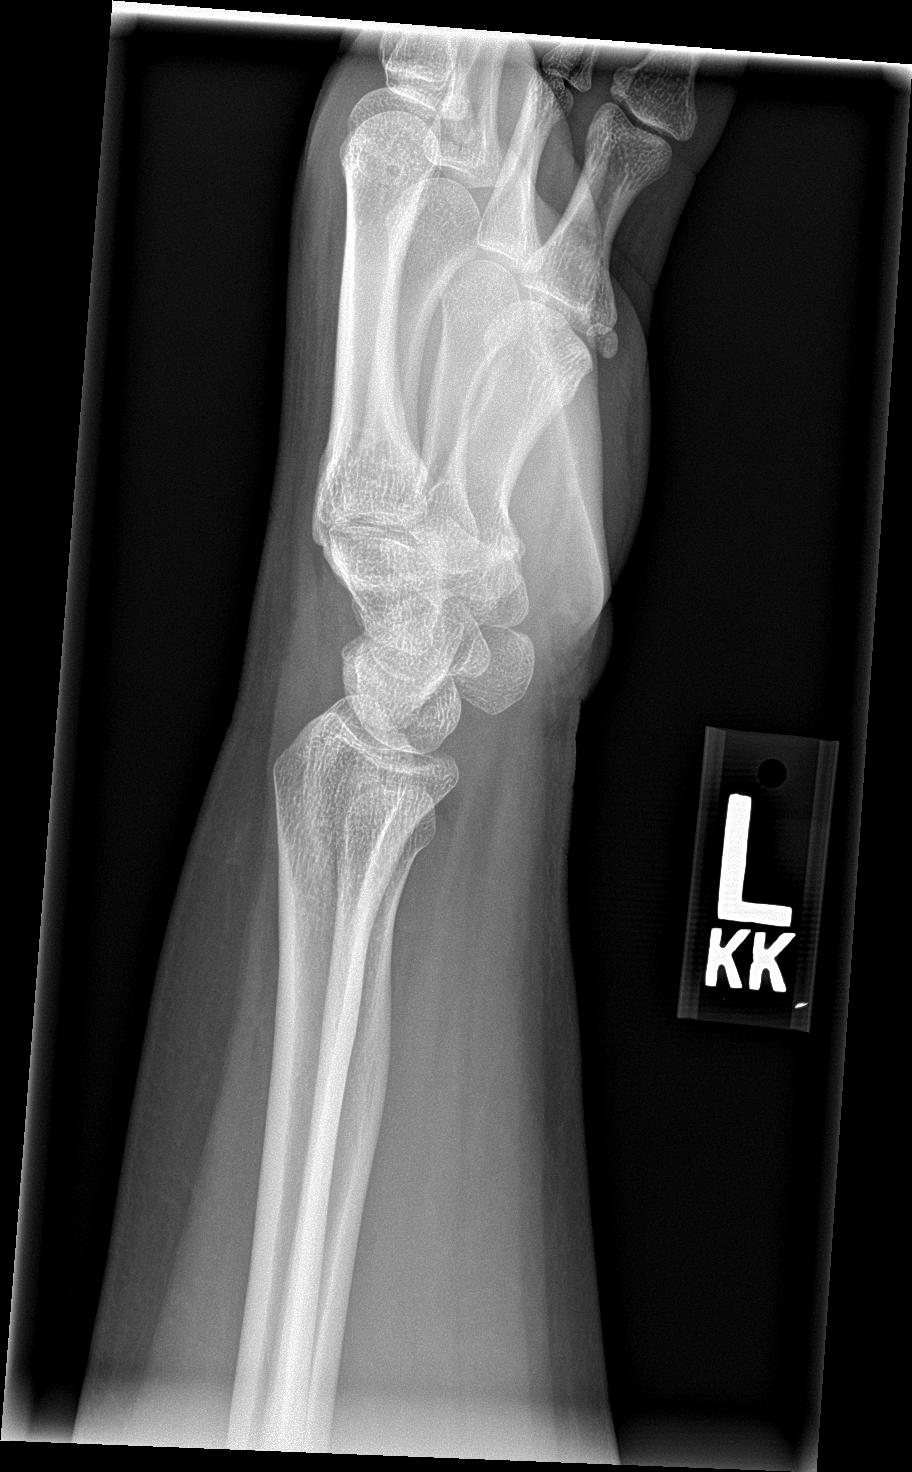

[wrist navicular]
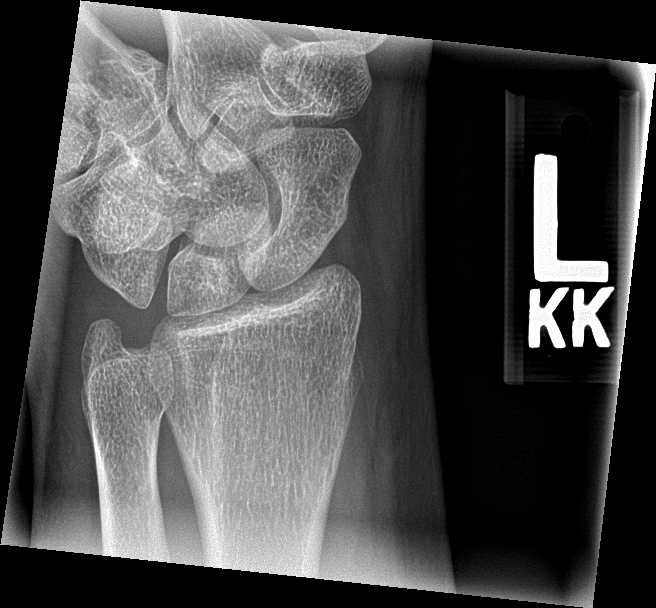

[4 of 4 positions shown; findings below may reference images not displayed]

FINDINGS: Osseous mineralization normal.

Joint spaces preserved.

No fracture, dislocation, or bone destruction.
IMPRESSION: Normal exam.

## 2021-12-30 ENCOUNTER — Ambulatory Visit: Payer: Medicaid Other

## 2022-01-05 ENCOUNTER — Ambulatory Visit: Payer: Medicaid Other
